# Patient Record
Sex: Female | Born: 1975 | Marital: Single | Smoking: Never smoker
Health system: Southern US, Community
[De-identification: ages and names within clinical notes are randomized; demographics above are authoritative.]

## PROBLEM LIST (undated history)

## (undated) DIAGNOSIS — Z789 Other specified health status: Secondary | ICD-10-CM

---

## 2015-04-27 DIAGNOSIS — O139 Gestational [pregnancy-induced] hypertension without significant proteinuria, unspecified trimester: Secondary | ICD-10-CM

## 2016-08-23 LAB — OB RESULTS CONSOLE HGB/HCT, BLOOD
HCT: 41
Hemoglobin: 13.5

## 2016-08-23 LAB — OB RESULTS CONSOLE RUBELLA ANTIBODY, IGM: Rubella: IMMUNE

## 2016-08-23 LAB — OB RESULTS CONSOLE ANTIBODY SCREEN: ANTIBODY SCREEN: NEGATIVE

## 2016-08-23 LAB — OB RESULTS CONSOLE HEPATITIS B SURFACE ANTIGEN: Hepatitis B Surface Ag: POSITIVE

## 2016-08-23 LAB — OB RESULTS CONSOLE HIV ANTIBODY (ROUTINE TESTING): HIV: NONREACTIVE

## 2016-08-23 LAB — OB RESULTS CONSOLE PLATELET COUNT: PLATELETS: 225

## 2016-09-29 ENCOUNTER — Ambulatory Visit (INDEPENDENT_AMBULATORY_CARE_PROVIDER_SITE_OTHER): Payer: Medicaid Other | Admitting: Family Medicine

## 2016-09-29 ENCOUNTER — Other Ambulatory Visit (HOSPITAL_COMMUNITY)
Admission: RE | Admit: 2016-09-29 | Discharge: 2016-09-29 | Disposition: A | Discharge status: Home / Self Care | Payer: Medicaid Other | Source: Ambulatory Visit | Attending: Family Medicine | Admitting: Family Medicine

## 2016-09-29 ENCOUNTER — Encounter: Payer: Self-pay | Admitting: Family Medicine

## 2016-09-29 VITALS — BP 113/58 | HR 76 | Ht 60.0 in | Wt 120.0 lb

## 2016-09-29 DIAGNOSIS — O09892 Supervision of other high risk pregnancies, second trimester: Secondary | ICD-10-CM | POA: Diagnosis not present

## 2016-09-29 DIAGNOSIS — O09299 Supervision of pregnancy with other poor reproductive or obstetric history, unspecified trimester: Secondary | ICD-10-CM | POA: Insufficient documentation

## 2016-09-29 DIAGNOSIS — O98419 Viral hepatitis complicating pregnancy, unspecified trimester: Secondary | ICD-10-CM

## 2016-09-29 DIAGNOSIS — O09522 Supervision of elderly multigravida, second trimester: Secondary | ICD-10-CM | POA: Diagnosis present

## 2016-09-29 DIAGNOSIS — Z3A00 Weeks of gestation of pregnancy not specified: Secondary | ICD-10-CM | POA: Insufficient documentation

## 2016-09-29 DIAGNOSIS — O0992 Supervision of high risk pregnancy, unspecified, second trimester: Secondary | ICD-10-CM

## 2016-09-29 DIAGNOSIS — O09899 Supervision of other high risk pregnancies, unspecified trimester: Secondary | ICD-10-CM

## 2016-09-29 DIAGNOSIS — O0990 Supervision of high risk pregnancy, unspecified, unspecified trimester: Secondary | ICD-10-CM | POA: Insufficient documentation

## 2016-09-29 DIAGNOSIS — O09292 Supervision of pregnancy with other poor reproductive or obstetric history, second trimester: Secondary | ICD-10-CM

## 2016-09-29 DIAGNOSIS — Z789 Other specified health status: Secondary | ICD-10-CM

## 2016-09-29 DIAGNOSIS — O34219 Maternal care for unspecified type scar from previous cesarean delivery: Secondary | ICD-10-CM

## 2016-09-29 DIAGNOSIS — R7611 Nonspecific reaction to tuberculin skin test without active tuberculosis: Secondary | ICD-10-CM

## 2016-09-29 DIAGNOSIS — B181 Chronic viral hepatitis B without delta-agent: Secondary | ICD-10-CM | POA: Insufficient documentation

## 2016-09-29 DIAGNOSIS — O09529 Supervision of elderly multigravida, unspecified trimester: Secondary | ICD-10-CM | POA: Insufficient documentation

## 2016-09-29 MED ORDER — ASPIRIN 81 MG PO CHEW: 81.0000 mg | CHEWABLE_TABLET | Freq: Every day | ORAL | 2 refills | Status: AC

## 2016-09-29 NOTE — Progress Notes (Signed)
CLINICAL DATA:  Pregnant patient in 2nd  trimester pregnancy with no complaints. Patient has history of preterm delivery due to pre-eclampsia  EXAM: abdominal OB ULTRASOUND   TECHNIQUE:  Abdominal ultrasound was performed for complete evaluation of the gestation. FINDINGS: Intrauterine gestational sac:  Cardiac Activity: present  Heart Rate:  154 bpm.  FL: 2.54 cm    BPD:3.87 cm Consistent with 17.[redacted] weeks gestation [facility]  Armandina StammerJennifer Howard RN

## 2016-09-29 NOTE — Patient Instructions (Signed)
Vaginal Birth After Cesarean Delivery Vaginal birth after cesarean delivery (VBAC) is giving birth vaginally after previously delivering a baby by a cesarean. In the past, if a woman had a cesarean delivery, all births afterward would be done by cesarean delivery. This is no longer true. It can be safe for the mother to try a vaginal delivery after having a cesarean delivery. It is important to discuss VBAC with your health care provider early in the pregnancy so you can understand the risks, benefits, and options. It will give you time to decide what is best in your particular case. The final decision about whether to have a VBAC or repeat cesarean delivery should be between you and your health care provider. Any changes in your health or your baby's health during your pregnancy may make it necessary to change your initial decision about VBAC. Women who plan to have a VBAC should check with their health care provider to be sure that:  The previous cesarean delivery was done with a low transverse uterine cut (incision) (not a vertical classical incision).  The birth canal is big enough for the baby.  There were no other operations on the uterus.  An electronic fetal monitor (EFM) will be on at all times during labor.  An operating room will be available and ready in case an emergency cesarean delivery is needed.  A health care provider and surgical nursing staff will be available at all times during labor to be ready to do an emergency delivery cesarean if necessary.  An anesthesiologist will be present in case an emergency cesarean delivery is needed.  The nursery is prepared and has adequate personnel and necessary equipment available to care for the baby in case of an emergency cesarean delivery. Benefits of VBAC  Shorter stay in the hospital.  Avoidance of risks associated with cesarean delivery, such as: ? Surgical complications, such as opening of the incision or hernia in the  incision. ? Injury to other organs. ? Fever. This can occur if an infection develops after surgery. It can also occur as a reaction to the medicine given to make you numb during the surgery.  Less blood loss and need for blood transfusions.  Lower risk of blood clots and infection.  Shorter recovery.  Decreased risk for having to remove the uterus (hysterectomy).  Decreased risk for the placenta to completely or partially cover the opening of the uterus (placenta previa) with a future pregnancy.  Decrease risk in future labor and delivery. Risks of a VBAC  Tearing (rupture) of the uterus. This is occurs in less than 1% of VBACs. The risk of this happening is higher if: ? Steps are taken to begin the labor process (induce labor) or stimulate or strengthen contractions (augment labor). ? Medicine is used to soften (ripen) the cervix.  Having to remove the uterus (hysterectomy) if it ruptures. VBAC should not be done if:  The previous cesarean delivery was done with a vertical (classical) or T-shaped incision or you do not know what kind of incision was made.  You had a ruptured uterus.  You have had certain types of surgery on your uterus, such as removal of uterine fibroids. Ask your health care provider about other types of surgeries that prevent you from having a VBAC.  You have certain medical or childbirth (obstetrical) problems.  There are problems with the baby.  You have had two previous cesarean deliveries and no vaginal deliveries. Other facts to know about VBAC:  It   is safe to have an epidural anesthetic with VBAC.  It is safe to turn the baby from a breech position (attempt an external cephalic version).  It is safe to try a VBAC with twins.  VBAC may not be successful if your baby weights 8.8 lb (4 kg) or more. However, weight predictions are not always accurate and should not be used alone to decide if VBAC is right for you.  There is an increased failure rate  if the time between the cesarean delivery and VBAC is less than 19 months.  Your health care provider may advise against a VBAC if you have preeclampsia (high blood pressure, protein in the urine, and swelling of face and extremities).  VBAC is often successful if you previously gave birth vaginally.  VBAC is often successful when the labor starts spontaneously before the due date.  Delivering a baby through a VBAC is similar to having a normal spontaneous vaginal delivery. This information is not intended to replace advice given to you by your health care provider. Make sure you discuss any questions you have with your health care provider. Document Released: 10/03/2006 Document Revised: 09/18/2015 Document Reviewed: 11/09/2012 Elsevier Interactive Patient Education  2018 [employer]vier Inc[employer]  

## 2016-09-29 NOTE — Progress Notes (Signed)
    PRENATAL VISIT NOTE  Subjective:  Latasha Frey is a 41 y.o. G2P0101 at 539w0d being seen today for transferring prenatal care. [facility] here due to high risk factor of pre-eclampsia with delivery < 37 wks.  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  Patient reports no complaints.   . Vag. Bleeding: None.  Movement: Present. Denies leaking of fluid.   The following portions of the patient's history were reviewed and updated as appropriate: allergies, current medications, past family history, past medical history, past social history, past surgical history and problem list. Problem list updated.  Objective:   Vitals:   09/29/16 1405 09/29/16 1513  BP:  (!) 113/58  Pulse:  76  Weight:  120 lb (54.4 kg)  Height: 5' (1.524 m)     Fetal Status: Fetal Heart Rate (bpm): 154 Fundal Height: 22 cm Movement: Present     General:  Alert, oriented and cooperative. Patient is in no acute distress.  Skin: Skin is warm and dry. No rash noted.   Cardiovascular: Normal heart rate noted  Respiratory: Normal respiratory effort, no problems with respiration noted  Abdomen: Soft, gravid, appropriate for gestational age. Pain/Pressure: Absent     Pelvic:  Cervical exam deferred        Extremities: Normal range of motion.  Edema: None  Mental Status: Normal mood and affect. Normal behavior. Normal judgment and thought content.   Assessment and Plan:  Pregnancy: G2P0101 at 289w0d  1. Supervision of high risk pregnancy,  antepartum Continue prenatal care. Schedule anatomy - US MFM OB DETAIL +14 WK; Future  2. Elderly multigravida in second trimester Declines genetics - Comprehensive metabolic panel - Protein / creatinine ratio, urine - US MFM OB DETAIL +14 WK; Future  3. Language barrier Speaks some English, declined interpreter today  4. Previous cesarean delivery affecting pregnancy, antepartum Desires TOLAC--information given today  5. Short interval between pregnancies affecting pregnancy, antepartum   6. History of pre-eclampsia in prior pregnancy, currently pregnant Baseline labs -CMP -Prot:Cret ratio - aspirin 81 MG chewable tablet; Chew 1 tablet (81 mg total) by mouth daily.  Dispense: 90 tablet; Refill: 2  7. PPD positive Seeing [facility]--per support person, has had this followed up  8. 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 Please refer to After Visit Summary for other counseling recommendations.  Return in about 4 weeks (around 10/27/2016).   Reva Boresanya S Zakariya Knickerbocker, MD

## 2016-09-30 LAB — COMPREHENSIVE METABOLIC PANEL
ALT: 10 IU/L (ref 0–32)
AST: 16 IU/L (ref 0–40)
Albumin/Globulin Ratio: 1.5 (ref 1.2–2.2)
Albumin: 4.1 g/dL (ref 3.5–5.5)
Alkaline Phosphatase: 54 IU/L (ref 39–117)
BUN/Creatinine Ratio: 13 (ref 9–23)
BUN: 7 mg/dL (ref 6–20)
Bilirubin Total: 0.2 mg/dL (ref 0.0–1.2)
CALCIUM: 8.8 mg/dL (ref 8.7–10.2)
CHLORIDE: 102 mmol/L (ref 96–106)
CO2: 23 mmol/L (ref 18–29)
Creatinine, Ser: 0.53 mg/dL — ABNORMAL LOW (ref 0.57–1.00)
GFR calc non Af Amer: 120 mL/min/{1.73_m2} (ref >59)
GFR, EST AFRICAN AMERICAN: 138 mL/min/{1.73_m2} (ref >59)
GLUCOSE: 78 mg/dL (ref 65–99)
Globulin, Total: 2.8 g/dL (ref 1.5–4.5)
Potassium: 4.8 mmol/L (ref 3.5–5.2)
Sodium: 137 mmol/L (ref 134–144)
TOTAL PROTEIN: 6.9 g/dL (ref 6.0–8.5)

## 2016-09-30 LAB — PROTEIN / CREATININE RATIO, URINE: Creatinine, Urine: 27.2 mg/dL

## 2016-10-01 LAB — GC/CHLAMYDIA PROBE AMP (~~LOC~~) NOT AT ARMC
CHLAMYDIA, DNA PROBE: NEGATIVE
Neisseria Gonorrhea: NEGATIVE

## 2016-10-18 ENCOUNTER — Ambulatory Visit (HOSPITAL_COMMUNITY): Admission: RE | Admit: 2016-10-18 | Payer: Medicaid Other | Source: Ambulatory Visit

## 2016-11-01 ENCOUNTER — Encounter: Payer: Medicaid Other | Admitting: Obstetrics & Gynecology

## 2016-11-18 ENCOUNTER — Ambulatory Visit (INDEPENDENT_AMBULATORY_CARE_PROVIDER_SITE_OTHER): Payer: Medicaid Other | Admitting: Family Medicine

## 2016-11-18 VITALS — BP 111/54 | HR 71 | Wt 128.0 lb

## 2016-11-18 DIAGNOSIS — Z789 Other specified health status: Secondary | ICD-10-CM

## 2016-11-18 DIAGNOSIS — O09292 Supervision of pregnancy with other poor reproductive or obstetric history, second trimester: Secondary | ICD-10-CM

## 2016-11-18 DIAGNOSIS — O09522 Supervision of elderly multigravida, second trimester: Secondary | ICD-10-CM

## 2016-11-18 DIAGNOSIS — O0990 Supervision of high risk pregnancy, unspecified, unspecified trimester: Secondary | ICD-10-CM

## 2016-11-18 DIAGNOSIS — O0992 Supervision of high risk pregnancy, unspecified, second trimester: Secondary | ICD-10-CM

## 2016-11-18 DIAGNOSIS — Z758 Other problems related to medical facilities and other health care: Secondary | ICD-10-CM

## 2016-11-18 DIAGNOSIS — O09299 Supervision of pregnancy with other poor reproductive or obstetric history, unspecified trimester: Secondary | ICD-10-CM

## 2016-11-18 DIAGNOSIS — O34219 Maternal care for unspecified type scar from previous cesarean delivery: Secondary | ICD-10-CM

## 2016-11-18 NOTE — Progress Notes (Signed)
   PRENATAL VISIT NOTE  Subjective:  Latasha Frey is a [age] y.o. G2P0101 at 4166w1d being seen today for ongoing prenatal care.  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  Patient reports no complaints.   . Vag. Bleeding: None.  Movement: Present. Denies leaking of fluid.   The following portions of the patient's history were reviewed and updated as appropriate: allergies, current medications, past family history, past medical history, past social history, past surgical history and problem list. Problem list updated.  Objective:   Vitals:   11/18/16 0906  BP: (!) 111/54  Pulse: 71  Weight: 128 lb (58.1 kg)    Fetal Status: Fetal Heart Rate (bpm): 150   Movement: Present     General:  Alert, oriented and cooperative. Patient is in no acute distress.  Skin: Skin is warm and dry. No rash noted.   Cardiovascular: Normal heart rate noted  Respiratory: Normal respiratory effort, no problems with respiration noted  Abdomen: Soft, gravid, appropriate for gestational age.  Pain/Pressure: Absent     Pelvic: Cervical exam deferred        Extremities: Normal range of motion.  Edema: None  Mental Status:  Normal mood and affect. Normal behavior. Normal judgment and thought content.   Assessment and Plan:  Pregnancy: G2P0101 at 2166w1d  1. Supervision of high risk pregnancy, antepartum FHT and FH normal. Normal weight gain. Measuring larger than dates. US ordered.  2. Elderly multigravida in second trimester No additional testing needed.  3. Language  barrier Interpreter declined.  4. Previous cesarean delivery affecting pregnancy, antepartum TOLAC desired  5. History of pre-eclampsia in prior pregnancy, currently pregnant BP normal. Cr, UP:C reviewed - normal. ASA 81mg .  Preterm labor symptoms and general obstetric precautions including but not limited to vaginal bleeding, contractions, leaking of fluid and fetal movement were reviewed in detail with the patient. Please refer to After Visit Summary for other counseling recommendations.  Return in about 4 weeks (around 12/16/2016) for OB f/u.   Levie HeritageJacob J Milcah Dulany, DO

## 2016-11-18 NOTE — Addendum Note (Signed)
Addended by: Levie HeritageSTINSON, JACOB J on: 11/18/2016 09:43 AM   Modules accepted: Orders

## 2016-11-24 ENCOUNTER — Ambulatory Visit (HOSPITAL_COMMUNITY)
Admission: RE | Admit: 2016-11-24 | Discharge: 2016-11-24 | Disposition: A | Discharge status: Home / Self Care | Payer: Medicaid Other | Source: Ambulatory Visit | Attending: Family Medicine | Admitting: Family Medicine

## 2016-11-24 ENCOUNTER — Encounter (HOSPITAL_COMMUNITY): Payer: Self-pay

## 2016-11-24 ENCOUNTER — Other Ambulatory Visit (HOSPITAL_COMMUNITY): Payer: Self-pay | Admitting: *Deleted

## 2016-11-24 DIAGNOSIS — O09899 Supervision of other high risk pregnancies, unspecified trimester: Secondary | ICD-10-CM

## 2016-11-24 DIAGNOSIS — O09522 Supervision of elderly multigravida, second trimester: Secondary | ICD-10-CM

## 2016-11-24 DIAGNOSIS — Z789 Other specified health status: Secondary | ICD-10-CM

## 2016-11-24 DIAGNOSIS — Z3A36 36 weeks gestation of pregnancy: Secondary | ICD-10-CM | POA: Diagnosis not present

## 2016-11-24 DIAGNOSIS — O0992 Supervision of high risk pregnancy, unspecified, second trimester: Secondary | ICD-10-CM | POA: Diagnosis not present

## 2016-11-24 DIAGNOSIS — O34219 Maternal care for unspecified type scar from previous cesarean delivery: Secondary | ICD-10-CM

## 2016-11-24 DIAGNOSIS — O0990 Supervision of high risk pregnancy, unspecified, unspecified trimester: Secondary | ICD-10-CM

## 2016-11-24 DIAGNOSIS — Z3A26 26 weeks gestation of pregnancy: Secondary | ICD-10-CM

## 2016-11-24 DIAGNOSIS — O98419 Viral hepatitis complicating pregnancy, unspecified trimester: Secondary | ICD-10-CM

## 2016-11-24 DIAGNOSIS — O09299 Supervision of pregnancy with other poor reproductive or obstetric history, unspecified trimester: Secondary | ICD-10-CM

## 2016-11-24 DIAGNOSIS — B181 Chronic viral hepatitis B without delta-agent: Secondary | ICD-10-CM

## 2016-11-24 HISTORY — DX: Other specified health status: Z78.9

## 2016-11-24 NOTE — Progress Notes (Signed)
Appointment Date: 11/24/2016 DOB: 10/17/1975 Referring Provider: Reva BoresPratt, Tanya S, MD Attending: Dr. Alpha GulaPaul Whitecar  Ms. Latasha MinerAyelech Frey and her partner, Latasha Frey, were seen for genetic counseling because of a maternal age of 41 y.o..   [employer] [id number], provided English/Amharic interpretation.  In summary:  Discussed AMA and associated risk for fetal aneuploidy - refused detailed discussion  Reviewed ultrasound   Refused discussion of additional screening options  Discussed diagnostic testing options - refused discussion  Amniocentesis  Reviewed family history concerns - none reported  Discussed carrier screening options - declined  CF  SMA  Hemoglobinopathies  They were counseled regarding maternal age and the association with risk for chromosome conditions due to nondisjunction.  I attempted to review chromosomes, nondisjunction, and the associated 1 in 4456 risk for fetal aneuploidy related to a maternal age of 41 y.o. at 6426 wks gestation.  However, Ms. Latasha MassonButa refused to allow me to discuss these.  Thus, we did not specifically discuss Down syndrome (trisomy 5721), trisomies 7813 and 5818, or sex chromosome aneuploidies (47,XXX and 47,XXY).   A complete ultrasound was performed today. The ultrasound report will be sent under separate cover. There were no visualized fetal anomalies or markers suggestive of aneuploidy. After discussion of the limitations of ultrasound,  I attempted to discuss the additional options for diagnostic testing or screening, however [name] refused discussion of additional screening or diagnostic testing.  They understand that ultrasound cannot rule out all birth defects or genetic syndromes. The patient was advised of this limitation and states she still does not want additional testing or screening at this time.   Ms. Latasha MassonButa was provided with written information regarding cystic fibrosis (CF), spinal muscular atrophy (SMA) and hemoglobinopathies including  the carrier frequency, availability of carrier screening and prenatal diagnosis if indicated. She declined screening for CF, SMA and hemoglobinopathies.  Both family histories were reviewed and found to be noncontributory for birth defects, intellectual disability, and known genetic conditions. Without further information regarding the provided family history, an accurate genetic risk cannot be calculated. Further genetic counseling is warranted if more information is obtained.  Ms. Latasha MassonButa denied exposure to environmental toxins or chemical agents. She denied the use of alcohol, tobacco or street drugs. She denied significant viral illnesses during the course of her pregnancy. Her medical and surgical histories were noncontributory.   I counseled this couple regarding the above risks and available options.  The approximate face-to-face time with the genetic counselor was 35 minutes.  Mady Gemmaaragh Jnae Thomaston, MS,  Certified Genetic Counselor

## 2016-12-16 ENCOUNTER — Ambulatory Visit (INDEPENDENT_AMBULATORY_CARE_PROVIDER_SITE_OTHER): Payer: Medicaid Other | Admitting: Family Medicine

## 2016-12-16 VITALS — BP 107/78 | HR 71 | Wt 135.0 lb

## 2016-12-16 DIAGNOSIS — O34219 Maternal care for unspecified type scar from previous cesarean delivery: Secondary | ICD-10-CM

## 2016-12-16 DIAGNOSIS — B181 Chronic viral hepatitis B without delta-agent: Secondary | ICD-10-CM

## 2016-12-16 DIAGNOSIS — O09299 Supervision of pregnancy with other poor reproductive or obstetric history, unspecified trimester: Secondary | ICD-10-CM

## 2016-12-16 DIAGNOSIS — O0990 Supervision of high risk pregnancy, unspecified, unspecified trimester: Secondary | ICD-10-CM

## 2016-12-16 DIAGNOSIS — Z789 Other specified health status: Secondary | ICD-10-CM

## 2016-12-16 DIAGNOSIS — O98419 Viral hepatitis complicating pregnancy, unspecified trimester: Secondary | ICD-10-CM

## 2016-12-16 DIAGNOSIS — O09522 Supervision of elderly multigravida, second trimester: Secondary | ICD-10-CM

## 2016-12-16 DIAGNOSIS — O0992 Supervision of high risk pregnancy, unspecified, second trimester: Secondary | ICD-10-CM

## 2016-12-16 DIAGNOSIS — O09292 Supervision of pregnancy with other poor reproductive or obstetric history, second trimester: Secondary | ICD-10-CM

## 2016-12-16 DIAGNOSIS — O98412 Viral hepatitis complicating pregnancy, second trimester: Secondary | ICD-10-CM

## 2016-12-16 NOTE — Progress Notes (Signed)
   PRENATAL VISIT NOTE  Subjective:  Latasha Frey is a 41 y.o. G2P0101 at [redacted]w[redacted]d being seen today for ongoing prenatal care.  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  Patient reports no complaints.  Contractions: Not present. Vag. Bleeding: None.  Movement: Present. Denies leaking of fluid.   The following portions of the patient's history were reviewed and updated as appropriate: allergies, current medications, past family history, past medical history, past social history, past surgical history and problem list. Problem list updated.  Objective:   Vitals:   12/16/16 0847  BP: 107/78  Pulse: 71  Weight: 135 lb (61.2 kg)    Fetal Status: Fetal Heart Rate (bpm): 140 Fundal Height: 28 cm Movement: Present     General:  Alert, oriented and cooperative. Patient is in no acute distress.  Skin: Skin is warm and dry. No rash noted.   Cardiovascular: Normal heart rate noted  Respiratory: Normal respiratory effort, no problems with respiration noted  Abdomen: Soft, gravid, appropriate for gestational age.  Pain/Pressure: Absent     Pelvic: Cervical exam deferred        Extremities: Normal range of motion.  Edema: None  Mental Status:  Normal mood and affect. Normal behavior. Normal judgment and thought content.   Assessment and Plan:  Pregnancy: G2P0101 at [redacted]w[redacted]d  1. Supervision of high risk pregnancy, antepartum FHT and FH normal  2. Previous cesarean delivery affecting pregnancy,  antepartum  3. 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 Language barrier Declined interpreter  5. History of pre-eclampsia in prior pregnancy, currently pregnant Continue ASA. BP normal  6. Elderly multigravida in second trimester No additional testing at this time.  Preterm labor symptoms and general obstetric precautions including but not limited to vaginal bleeding, contractions, leaking of fluid and fetal movement were reviewed in detail with the patient. Please refer to After Visit Summary for other counseling recommendations.  Return in about 2 weeks (around 12/30/2016) for OB f/u.   Levie Heritage, DO

## 2016-12-22 ENCOUNTER — Ambulatory Visit (HOSPITAL_COMMUNITY)
Admission: RE | Admit: 2016-12-22 | Discharge: 2016-12-22 | Disposition: A | Discharge status: Home / Self Care | Payer: Medicaid Other | Source: Ambulatory Visit | Attending: Family Medicine | Admitting: Family Medicine

## 2016-12-22 ENCOUNTER — Encounter (HOSPITAL_COMMUNITY): Payer: Self-pay

## 2016-12-24 ENCOUNTER — Other Ambulatory Visit: Payer: Medicaid Other

## 2016-12-24 DIAGNOSIS — Z3490 Encounter for supervision of normal pregnancy, unspecified, unspecified trimester: Secondary | ICD-10-CM

## 2016-12-24 NOTE — Progress Notes (Signed)
Patient sent to lab for her 2 hr gtt. Armandina StammerJennifer Kitti Mcclish RNBSN

## 2016-12-30 LAB — CBC
Hematocrit: 35.5 % (ref 34.0–46.6)
Hemoglobin: 11.5 g/dL (ref 11.1–15.9)
MCH: 27.4 pg (ref 26.6–33.0)
MCHC: 32.4 g/dL (ref 31.5–35.7)
MCV: 85 fL (ref 79–97)
PLATELETS: 197 10*3/uL (ref 150–379)
RBC: 4.2 x10E6/uL (ref 3.77–5.28)
RDW: 13.6 % (ref 12.3–15.4)
WBC: 7.2 10*3/uL (ref 3.4–10.8)

## 2016-12-30 LAB — SPECIMEN STATUS REPORT

## 2016-12-30 LAB — RPR: RPR: NONREACTIVE

## 2016-12-30 LAB — HIV ANTIBODY (ROUTINE TESTING W REFLEX): HIV Screen 4th Generation wRfx: NONREACTIVE

## 2016-12-31 ENCOUNTER — Ambulatory Visit (INDEPENDENT_AMBULATORY_CARE_PROVIDER_SITE_OTHER): Payer: Medicaid Other | Admitting: Family Medicine

## 2016-12-31 VITALS — BP 127/72 | HR 58 | Wt 136.0 lb

## 2016-12-31 DIAGNOSIS — O09522 Supervision of elderly multigravida, second trimester: Secondary | ICD-10-CM

## 2016-12-31 DIAGNOSIS — O0990 Supervision of high risk pregnancy, unspecified, unspecified trimester: Secondary | ICD-10-CM

## 2016-12-31 DIAGNOSIS — O98419 Viral hepatitis complicating pregnancy, unspecified trimester: Secondary | ICD-10-CM

## 2016-12-31 DIAGNOSIS — B181 Chronic viral hepatitis B without delta-agent: Secondary | ICD-10-CM

## 2016-12-31 DIAGNOSIS — O09523 Supervision of elderly multigravida, third trimester: Secondary | ICD-10-CM

## 2016-12-31 DIAGNOSIS — O98413 Viral hepatitis complicating pregnancy, third trimester: Secondary | ICD-10-CM

## 2016-12-31 DIAGNOSIS — O0993 Supervision of high risk pregnancy, unspecified, third trimester: Secondary | ICD-10-CM

## 2016-12-31 DIAGNOSIS — O34219 Maternal care for unspecified type scar from previous cesarean delivery: Secondary | ICD-10-CM

## 2016-12-31 NOTE — Progress Notes (Signed)
Per [facility] it was a collection error. Will need redo collection

## 2016-12-31 NOTE — Progress Notes (Signed)
   PRENATAL VISIT NOTE  Subjective:  Latasha Frey is a 41 y.o. G2P0101 at 7222w6d being seen today for ongoing prenatal care.  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  Patient reports no complaints.  Contractions: Not present. Vag. Bleeding: None.  Movement: Present. Denies leaking of fluid.   The following portions of the patient's history were reviewed and updated as appropriate: allergies, current medications, past family history, past medical history, past social history, past surgical history and problem list. Problem list updated.  Objective:   Vitals:   12/31/16 1103  BP: 127/72  Pulse: (!) 58  Weight: 136 lb (61.7 kg)    Fetal Status: Fetal Heart Rate (bpm): 156 Fundal Height: 30 cm Movement: Present     General:  Alert, oriented and cooperative. Patient is in no acute distress.  Skin: Skin is warm and dry. No rash noted.   Cardiovascular: Normal heart rate noted  Respiratory: Normal respiratory effort, no problems with respiration noted  Abdomen: Soft, gravid, appropriate for gestational age.  Pain/Pressure: Absent     Pelvic: Cervical exam deferred        Extremities: Normal range of motion.  Edema: None  Mental Status:  Normal mood and affect. Normal behavior. Normal judgment and thought content.   Assessment and Plan:  Pregnancy: G2P0101 at 2222w6d  1. Supervision of high risk pregnancy, antepartum FHT and FH normal. Lab error - did not run 2hr GTT. Will repeat  2. Previous  cesarean delivery affecting pregnancy, antepartum TOLAC consent signed  3. 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 Elderly multigravida in second trimester No additional testing   Preterm labor symptoms and general obstetric precautions including but not limited to vaginal bleeding, contractions, leaking of fluid and fetal movement were reviewed in detail with the patient. Please refer to After Visit Summary for other counseling recommendations.  Return in about 2 weeks (around 01/14/2017) for OB f/u.   Levie HeritageJacob J Stinson, DO

## 2016-12-31 NOTE — Progress Notes (Signed)
Explained to patient that she will need to repeat two hour gtt because of lab error. Armandina StammerJennifer Howard RNBSN

## 2017-01-05 ENCOUNTER — Encounter (HOSPITAL_COMMUNITY): Payer: Self-pay

## 2017-01-05 ENCOUNTER — Other Ambulatory Visit (HOSPITAL_COMMUNITY): Payer: Self-pay | Admitting: Maternal and Fetal Medicine

## 2017-01-05 ENCOUNTER — Ambulatory Visit (HOSPITAL_COMMUNITY)
Admission: RE | Admit: 2017-01-05 | Discharge: 2017-01-05 | Disposition: A | Discharge status: Home / Self Care | Payer: Medicaid Other | Source: Ambulatory Visit | Attending: Family Medicine | Admitting: Family Medicine

## 2017-01-05 ENCOUNTER — Other Ambulatory Visit: Payer: Medicaid Other

## 2017-01-05 DIAGNOSIS — O09893 Supervision of other high risk pregnancies, third trimester: Secondary | ICD-10-CM | POA: Insufficient documentation

## 2017-01-05 DIAGNOSIS — O09523 Supervision of elderly multigravida, third trimester: Secondary | ICD-10-CM | POA: Diagnosis not present

## 2017-01-05 DIAGNOSIS — O09299 Supervision of pregnancy with other poor reproductive or obstetric history, unspecified trimester: Secondary | ICD-10-CM | POA: Diagnosis not present

## 2017-01-05 DIAGNOSIS — Z3A32 32 weeks gestation of pregnancy: Secondary | ICD-10-CM | POA: Diagnosis not present

## 2017-01-05 DIAGNOSIS — Z362 Encounter for other antenatal screening follow-up: Secondary | ICD-10-CM | POA: Diagnosis not present

## 2017-01-05 DIAGNOSIS — O34219 Maternal care for unspecified type scar from previous cesarean delivery: Secondary | ICD-10-CM | POA: Diagnosis not present

## 2017-01-05 DIAGNOSIS — I4949 Other premature depolarization: Secondary | ICD-10-CM | POA: Diagnosis not present

## 2017-01-05 DIAGNOSIS — O09899 Supervision of other high risk pregnancies, unspecified trimester: Secondary | ICD-10-CM

## 2017-01-05 DIAGNOSIS — B181 Chronic viral hepatitis B without delta-agent: Secondary | ICD-10-CM

## 2017-01-05 DIAGNOSIS — Z789 Other specified health status: Secondary | ICD-10-CM

## 2017-01-05 DIAGNOSIS — O09219 Supervision of pregnancy with history of pre-term labor, unspecified trimester: Secondary | ICD-10-CM | POA: Insufficient documentation

## 2017-01-05 DIAGNOSIS — O98413 Viral hepatitis complicating pregnancy, third trimester: Secondary | ICD-10-CM | POA: Diagnosis not present

## 2017-01-05 DIAGNOSIS — O09522 Supervision of elderly multigravida, second trimester: Secondary | ICD-10-CM

## 2017-01-05 DIAGNOSIS — B1910 Unspecified viral hepatitis B without hepatic coma: Secondary | ICD-10-CM | POA: Diagnosis not present

## 2017-01-05 DIAGNOSIS — O0990 Supervision of high risk pregnancy, unspecified, unspecified trimester: Secondary | ICD-10-CM

## 2017-01-05 DIAGNOSIS — O98419 Viral hepatitis complicating pregnancy, unspecified trimester: Secondary | ICD-10-CM

## 2017-01-06 ENCOUNTER — Other Ambulatory Visit: Payer: Medicaid Other

## 2017-01-06 DIAGNOSIS — Z3490 Encounter for supervision of normal pregnancy, unspecified, unspecified trimester: Secondary | ICD-10-CM

## 2017-01-08 LAB — ABO AND RH: Rh Factor: POSITIVE

## 2017-01-08 LAB — HEPATITIS B DNA, ULTRAQUANTITATIVE, PCR
HBV DNA SERPL PCR-ACNC: 2790 [IU]/mL
HBV DNA SERPL PCR-LOG IU: 3.446 {Log_IU}/mL

## 2017-01-08 LAB — GLUCOSE TOLERANCE, 2 HOURS W/ 1HR
Glucose, 1 hour: 158 mg/dL (ref 65–179)
Glucose, 2 hour: 124 mg/dL (ref 65–152)
Glucose, Fasting: 78 mg/dL (ref 65–91)

## 2017-01-13 ENCOUNTER — Encounter: Payer: Medicaid Other | Admitting: Obstetrics & Gynecology

## 2017-05-28 ENCOUNTER — Encounter (HOSPITAL_COMMUNITY): Payer: Self-pay

## 2018-06-27 IMAGING — US US MFM UA CORD DOPPLER
3 series · 13 of 28 positions shown · non-contrast
Comparison: none

[Series 1: us mfm ua cord doppler · 9 of 50 slices shown (1 of 3)]
[im 3/50]
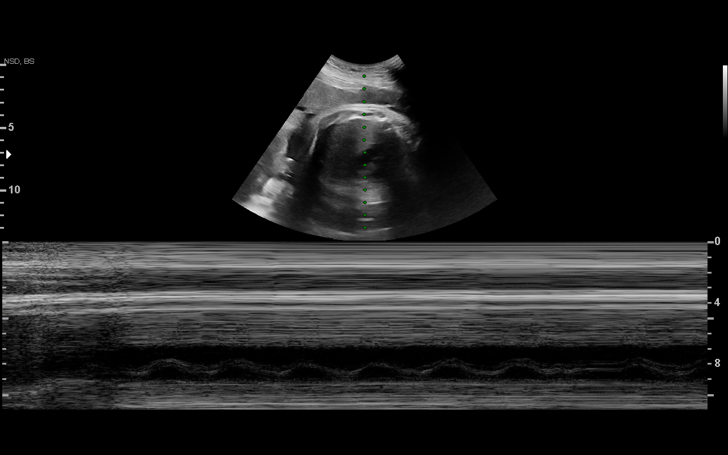
[im 9/50]
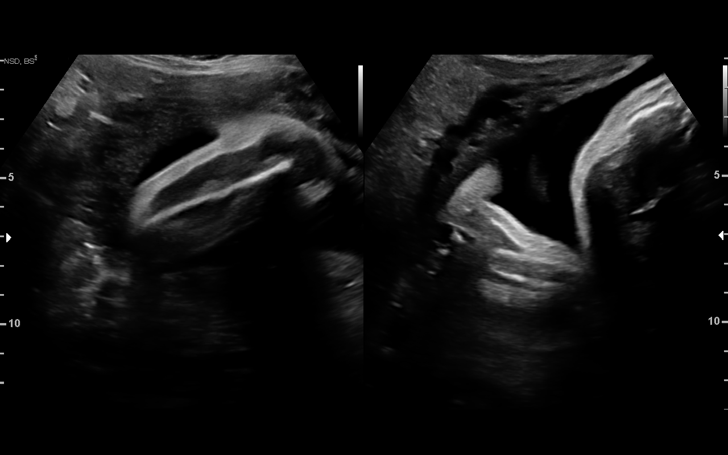
[im 14/50]
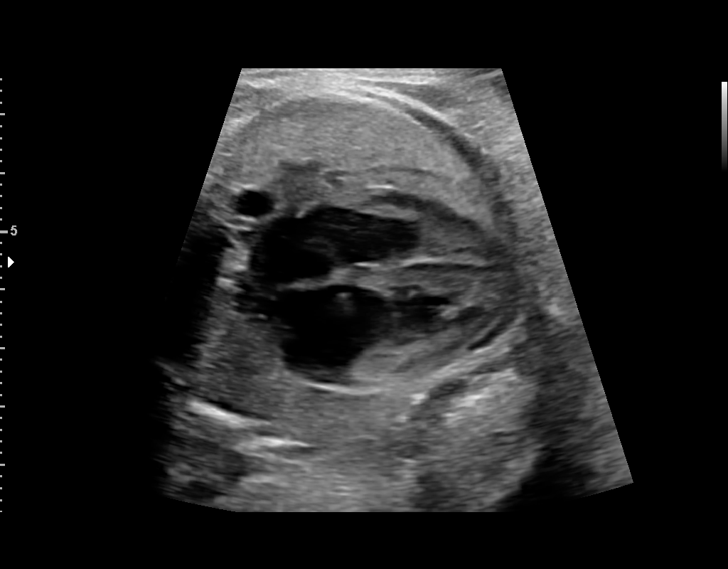
[im 20/50]
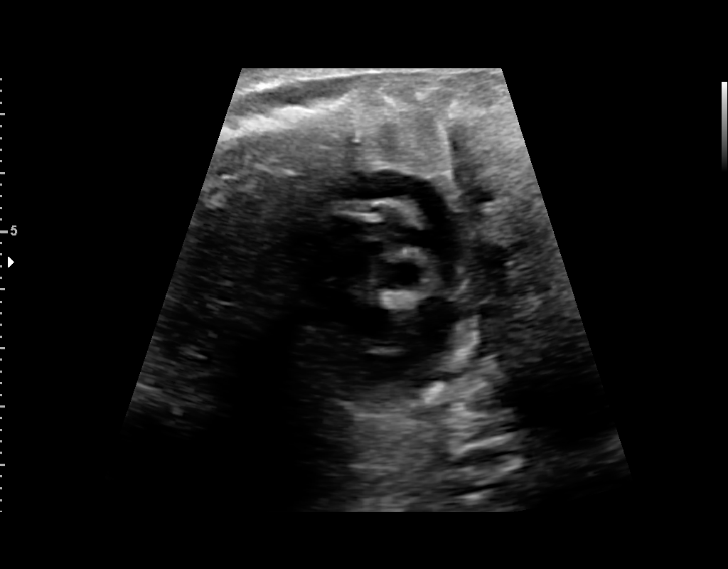
[im 25/50]
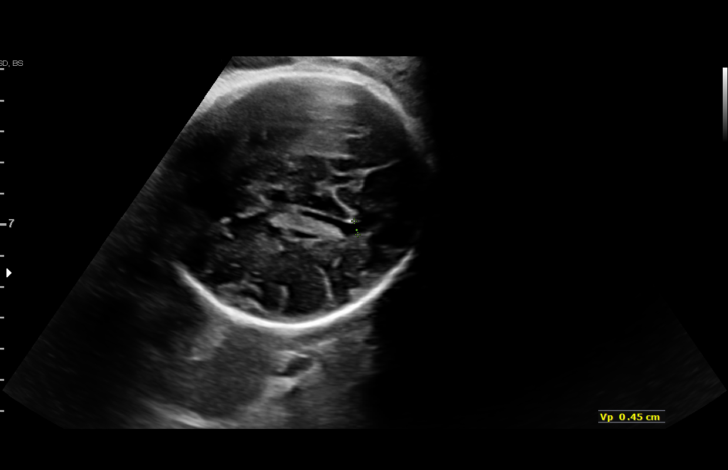
[im 30/50]
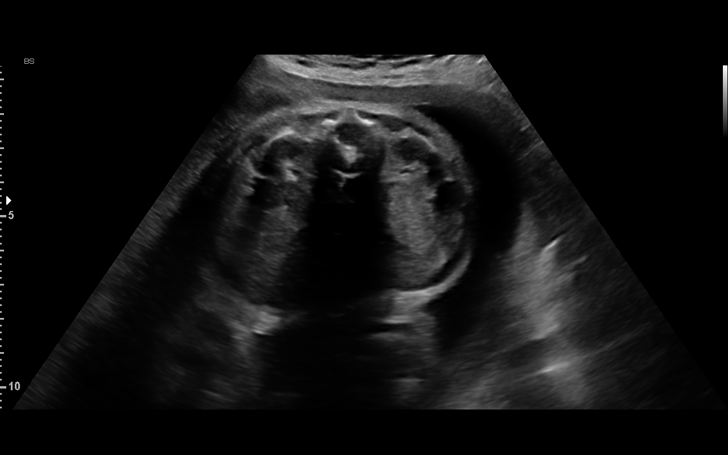
[im 39/50]
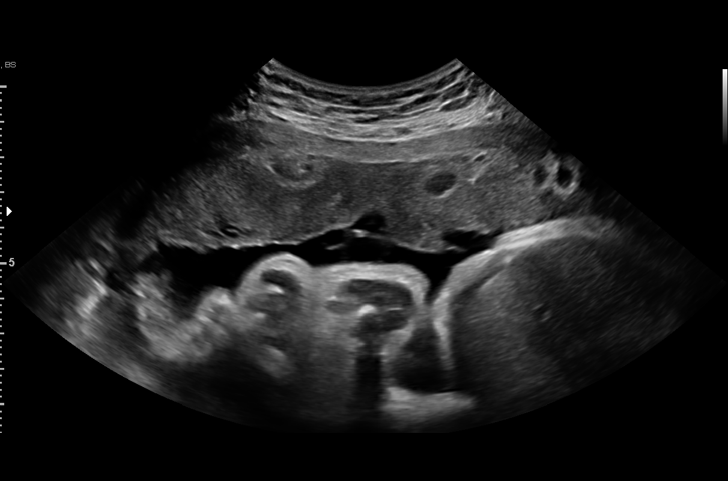
[im 44/50]
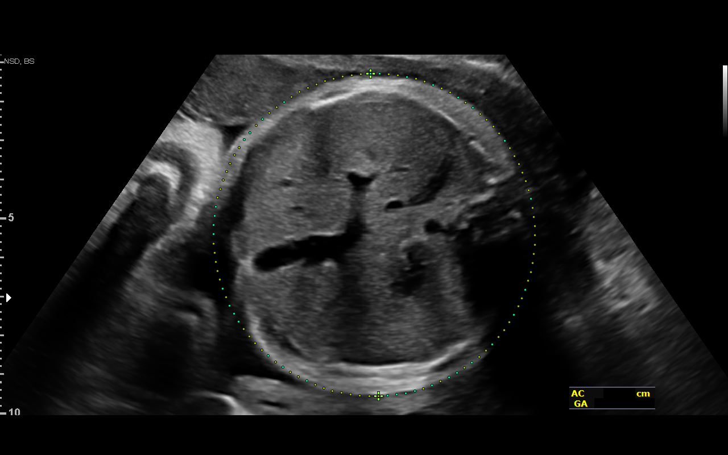
[im 50/50]
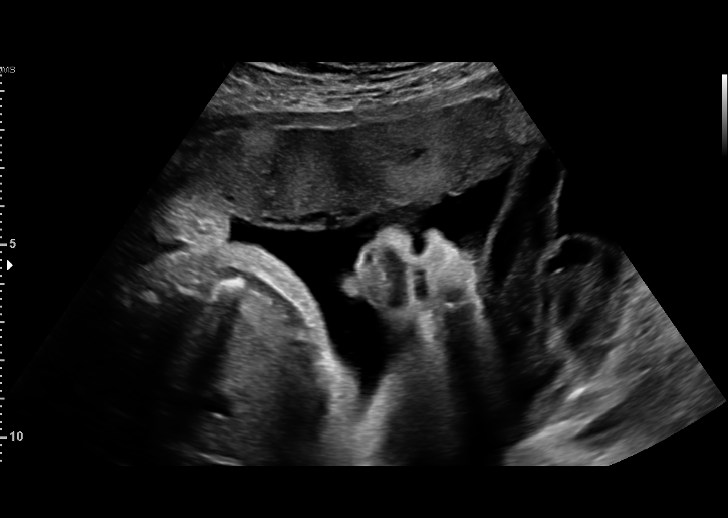

[Series 3: us mfm ua cord doppler · 2 of 11 slices shown (2 of 3)]
[im 4/11]
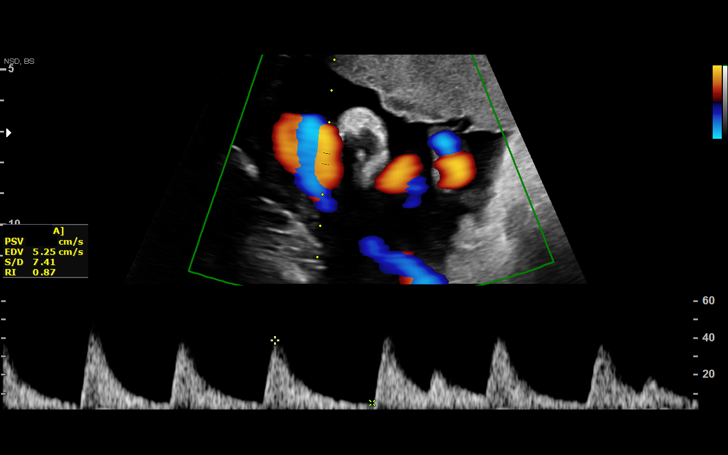
[im 11/11]
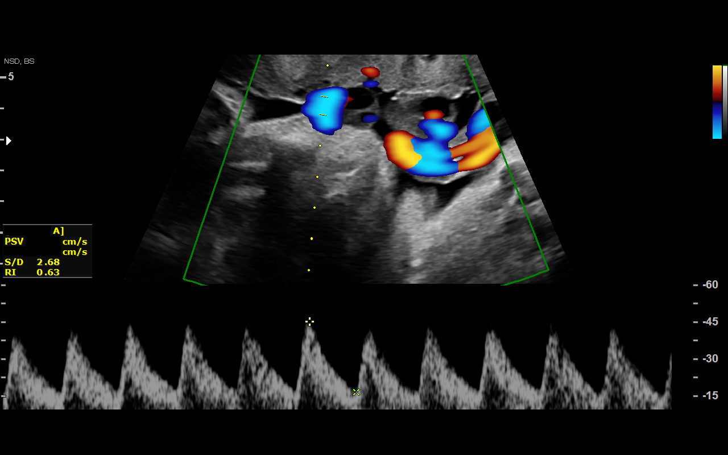

[Series 4: us mfm ua cord doppler · 2 of 13 slices shown (3 of 3)]
[im 4/13]
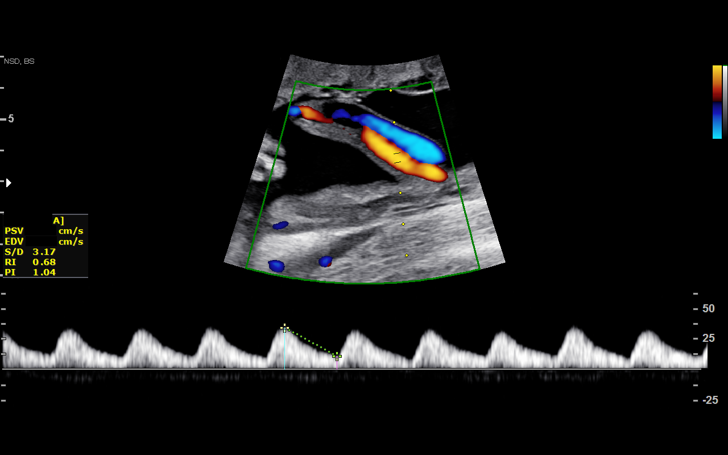
[im 10/13]
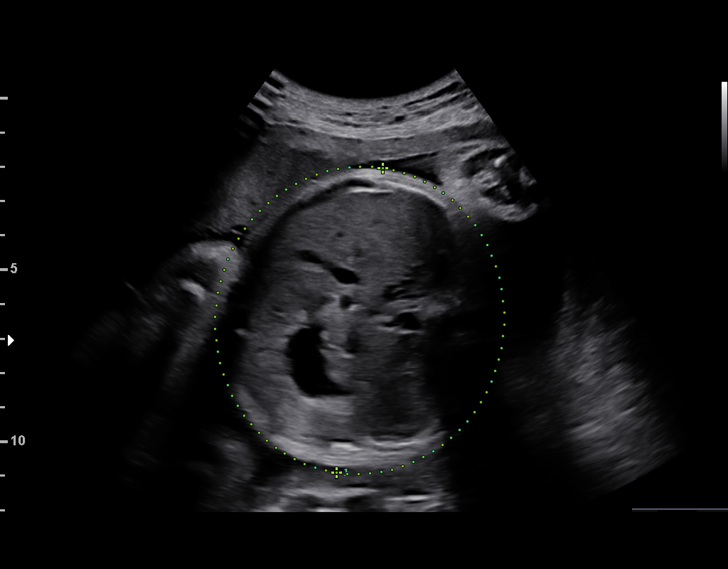

[13 of 28 positions shown; findings below may reference images not displayed]

1  KETELIE IZARAC            [id number]      [id number]     [id number]
2  KETELIE IZARAC            [id number]      [id number]     [id number]
Indications

32 weeks gestation of pregnancy
Advanced maternal age multigravida 35+,
second trimester; declined
Poor obstetric history: Previous preterm
delivery, antepartum
Poor obstetric history: Previous
preeclampsia / eclampsia/gestational HTN
Previous cesarean delivery, antepartum
Short interval between pregancies, 2nd
trimester
Hepatitis B complicating pregnancy             O98.419,
Encounter for other antenatal screening
follow-up
OB History

Gravidity:    2         Term:   0        Prem:   1        SAB:   0
TOP:          0       Ectopic:  0        Living: 1
Fetal Evaluation

Num Of Fetuses:     1
Fetal Heart         111
Rate(bpm):
Cardiac Activity:   Arrhythmia noted
Presentation:       Cephalic
Placenta:           Anterior, above cervical os
P. Cord Insertion:  Previously Visualized
Amniotic Fluid
AFI FV:      Subjectively within normal limits

AFI Sum(cm)     %Tile       Largest Pocket(cm)
17.93           66

RUQ(cm)       RLQ(cm)       LUQ(cm)        LLQ(cm)
5.77
Biometry

BPD:      80.1  mm     G. Age:  32w 1d         34  %    CI:        76.57   %   70 - 86
FL/HC:      21.2   %   19.1 -
HC:       290   mm     G. Age:  31w 6d          8  %    HC/AC:      1.04       0.96 -
AC:      278.5  mm     G. Age:  32w 0d         34  %    FL/BPD:     76.7   %   71 - 87
FL:       61.4  mm     G. Age:  31w 6d         23  %    FL/AC:      22.0   %   20 - 24

Est. FW:    6074  gm      4 lb 2 oz     47  %
Gestational Age

LMP:           27w 0d       Date:   06/30/16                 EDD:   04/06/17
U/S Today:     32w 0d                                        EDD:   03/02/17
Best:          32w 3d    Det. By:   U/S  (11/24/16)          EDD:   02/27/17
Anatomy

Cranium:               Appears normal         Aortic Arch:            Previously seen
Cavum:                 Previously seen        Ductal Arch:            Previously seen
Ventricles:            Appears normal         Diaphragm:              Appears normal
Choroid Plexus:        Previously seen        Stomach:                Appears normal, left
sided
Cerebellum:            Previously seen        Abdomen:                Appears normal
Posterior Fossa:       Previously seen        Abdominal Wall:         Previously seen
Nuchal Fold:           Not applicable (>20    Cord Vessels:           Previously seen
wks GA)
Face:                  Appears normal         Kidneys:                Appear normal
(orbits and profile)
Lips:                  Appears normal         Bladder:                Appears normal
Thoracic:              Appears normal         Spine:                  Previously seen
Heart:                 Appears normal         Upper Extremities:      Visualized
(4CH, axis, and situs
RVOT:                  Appears normal         Lower Extremities:      Previously seen
LVOT:                  Appears normal

Other:  Male gender previously seen. Heels and 5th digit previously
visualized. Technically difficult due to fetal position.
Doppler - Fetal Vessels

Umbilical Artery
S/D     %tile                                     PSV    ADFV    RDFV
(cm/s)
3.[REDACTED]      No      No

Cervix Uterus Adnexa

Cervix
Not visualized (advanced GA >74wks)
Impression

SIUP at 32+3 weeks
Normal interval anatomy; anatomic survey complete;
infrequent ectopy (PACs/PVCs)
Normal amniotic fluid volume
Appropriate interval growth with EFW at the 47th %tile
UA dopplers were normal for this GA
Recommendations

Weekly prenatal visits to evaluate for a true tachyarrhythmia
(FHR > 200 bpm); continued ectopy will be well tolerated by
the fetus
Follow-up ultrasound for growth in 4 weeks
